# Patient Record
Sex: Female | Born: 1990 | Race: Black or African American | Hispanic: No | Marital: Single
Health system: Southern US, Community
[De-identification: ages and names within clinical notes are randomized; demographics above are authoritative.]

---

## 2011-03-02 ENCOUNTER — Ambulatory Visit (INDEPENDENT_AMBULATORY_CARE_PROVIDER_SITE_OTHER): Payer: 59 | Admitting: Gynecology

## 2011-03-02 DIAGNOSIS — N751 Abscess of Bartholin's gland: Secondary | ICD-10-CM

## 2011-03-02 DIAGNOSIS — N949 Unspecified condition associated with female genital organs and menstrual cycle: Secondary | ICD-10-CM

## 2011-03-02 HISTORY — PX: INCISE AND DRAIN ABCESS: PRO64

## 2011-03-13 ENCOUNTER — Ambulatory Visit (INDEPENDENT_AMBULATORY_CARE_PROVIDER_SITE_OTHER): Payer: 59 | Admitting: Gynecology

## 2011-03-13 DIAGNOSIS — N751 Abscess of Bartholin's gland: Secondary | ICD-10-CM

## 2011-03-26 ENCOUNTER — Encounter: Payer: 59 | Admitting: Gynecology

## 2011-03-29 ENCOUNTER — Encounter: Payer: Self-pay | Admitting: Anesthesiology

## 2011-04-03 ENCOUNTER — Encounter: Payer: 59 | Admitting: Gynecology

## 2018-04-01 ENCOUNTER — Encounter (HOSPITAL_COMMUNITY): Payer: Self-pay

## 2018-04-01 ENCOUNTER — Emergency Department (HOSPITAL_COMMUNITY)
Admission: EM | Admit: 2018-04-01 | Discharge: 2018-04-01 | Disposition: A | Discharge status: Home / Self Care | Payer: 59 | Attending: Emergency Medicine | Admitting: Emergency Medicine

## 2018-04-01 DIAGNOSIS — H5789 Other specified disorders of eye and adnexa: Secondary | ICD-10-CM | POA: Insufficient documentation

## 2018-04-01 DIAGNOSIS — H5711 Ocular pain, right eye: Secondary | ICD-10-CM | POA: Diagnosis present

## 2018-04-01 MED ORDER — HYPROMELLOSE (GONIOSCOPIC) 2.5 % OP SOLN: 2.0000 [drp] | OPHTHALMIC | 12 refills | Status: AC | PRN

## 2018-04-01 MED ORDER — FLUORESCEIN SODIUM 1 MG OP STRP
1.0000 | ORAL_STRIP | Freq: Once | OPHTHALMIC | Status: AC
  Administered 2018-04-01: 1 via OPHTHALMIC
  Filled 2018-04-01: qty 1

## 2018-04-01 MED ORDER — ERYTHROMYCIN 5 MG/GM OP OINT: TOPICAL_OINTMENT | OPHTHALMIC | 0 refills | Status: AC

## 2018-04-01 MED ORDER — TETRACAINE HCL 0.5 % OP SOLN
2.0000 [drp] | Freq: Once | OPHTHALMIC | Status: AC
  Administered 2018-04-01: 2 [drp] via OPHTHALMIC
  Filled 2018-04-01: qty 4

## 2018-04-01 NOTE — ED Provider Notes (Signed)
[facility] EMERGENCY DEPARTMENT Provider Note   CSN: [id number] Arrival date & time: 04/01/18  1843     History   Chief Complaint Chief Complaint  Patient presents with  . Eye Pain    HPI Sara Collier is a 27 y.o. female.  Sara Collier is a 27 y.o. Female who is otherwise healthy, presents to the ED for evaluation of 1 week of right eye pain.  She reports her eye and surrounding eyelids have had a constant dull ache, and has felt very irritated.  She reports when she wakes up in the morning there is crusting over her eyelids and she reports frequent clear drainage throughout the day.  This morning when she woke up she felt that the eye was a little bit swollen and hurting more than usual so she decided to come in for evaluation.  She denies redness of the eye.  No injury to the eye.  She denies any vision changes.  Does not wear contacts or glasses.  No issues with the left eye.  She has not tried anything prior to arrival to treat her symptoms.     History reviewed. No pertinent past medical history.  There are no active problems to display for this patient.   Past Surgical History:  Procedure Laterality Date  . INCISE AND DRAIN ABCESS  03/02/2011   BARTHOLIN ABCESS     OB History   None      Home Medications    Prior to Admission medications   Not on File    Family History Family History  Problem Relation Age of Onset  . Hypertension Father   . Hypertension Maternal Aunt   . Breast cancer Maternal Grandmother     Social History Social History   Tobacco Use  . Smoking status: Unknown If Ever Smoked  . Smokeless tobacco: Never Used  Substance Use Topics  . Alcohol use: No  . Drug use: Not on file     Allergies   Patient has no known allergies.   Review of Systems Review of Systems  Constitutional: Negative for chills and fever.  HENT: Negative for congestion, rhinorrhea and sore throat.   Eyes: Positive for pain,  discharge and itching. Negative for photophobia, redness and visual disturbance.  Gastrointestinal: Negative for nausea and vomiting.  Skin: Negative for color change and rash.  Neurological: Negative for dizziness, weakness, numbness and headaches.     Physical Exam Updated Vital Signs BP (!) 126/98   Pulse 68   Temp 98.7 F (37.1 C) (Oral)   Resp 20   LMP 03/19/2018   SpO2 100%   Physical Exam  Constitutional: She appears well-developed and well-nourished. No distress.  HENT:  Head: Normocephalic and atraumatic.  Eyes: Right eye exhibits no discharge. Left eye exhibits no discharge.  Visual acuity: 20/16 bilateral, 20/16 R, 20/20 L Left eye is unremarkable. Right eye with mild swelling over the upper eyelid, no erythema, upper eyelid is tender to palpation.  Conjunctivae are mildly erythematous, no scleral injection, no discharge noted on exam.  Eyes do seem somewhat dry.  Pupils are equal round and reactive, EOMs intact bilaterally, no consensual pain with pupillary constriction. Fluorescein exam of right eye shows no evidence of corneal abrasion, or dendritic staining Right eye pressure 15 mmHg  Pulmonary/Chest: Effort normal. No respiratory distress.  Neurological: She is alert. Coordination normal.  Skin: Skin is warm and dry. She is not diaphoretic.  Psychiatric: She has a normal mood and affect.  Her behavior is normal.  Nursing note and vitals reviewed.    ED Treatments / Results  Labs (all labs ordered are listed, but only abnormal results are displayed) Labs Reviewed - No data to display  EKG None  Radiology No results found.  Procedures Procedures (including critical care time)  Medications Ordered in ED Medications  tetracaine (PONTOCAINE) 0.5 % ophthalmic solution 2 drop (2 drops Right Eye Given 04/01/18 2000)  fluorescein ophthalmic strip 1 strip (1 strip Right Eye Given 04/01/18 2000)     Initial Impression / Assessment and Plan / ED Course  I  have reviewed the triage vital signs and the nursing notes.  Pertinent labs & imaging results that were available during my care of the patient were reviewed by me and considered in my medical decision making (see chart for details).  Patient presents with right eye pain and discomfort, she reports lots of clear drainage and crusting.  Normal visual acuity on exam there is minimal periorbital swelling primarily over the upper eyelid, no erythema, no proptosis.  Mild conjunctival erythema, no scleral injection or drainage present.  Normal eye pressure.  EOMs intact, no consensual pain, no evidence of iritis.  Fluorescein staining shows no evidence of corneal abrasion or dendritic staining.  Presentation not concerning for acute angle closure glaucoma.  Patient does have some mild swelling and tenderness over and above the upper eyelid, she could have a blocked tear duct versus very mild conjunctivitis.  Will treat with wetting drops and erythromycin ointment.  Patient to follow-up with ophthalmology.  Return precautions discussed.  Patient expresses understanding and is in agreement with plan.  Final Clinical Impressions(s) / ED Diagnoses   Final diagnoses:  Pain of right eye    ED Discharge Orders        Ordered    erythromycin ophthalmic ointment     04/01/18 2018    hydroxypropyl methylcellulose / hypromellose (ISOPTO TEARS / GONIOVISC) 2.5 % ophthalmic solution  As needed     04/01/18 2018       Dartha LodgeFord, Karmen Altamirano N, PA-C 04/02/18 Armandina [id number]    Nanavati, Ankit, MD 04/02/18 [phone]

## 2018-04-01 NOTE — Discharge Instructions (Signed)
Please use antibiotic eye ointment as directed you can also use wetting drops to help with dryness and discomfort.  Please use warm compresses.  Please follow-up with ophthalmology, especially if symptoms are not improving.  Return for changes in vision, worsening eye pain, swelling around the eye, or fevers.

## 2018-04-01 NOTE — ED Notes (Signed)
Patient able to ambulate independently  

## 2018-04-01 NOTE — ED Notes (Signed)
Pt does not wear contacts or glasses 

## 2018-04-01 NOTE — ED Triage Notes (Signed)
Pt states that for the past week her R eye has been hurting, no redness or swelling, denies injury. Reports drainage this AM

## 2019-05-02 ENCOUNTER — Emergency Department (HOSPITAL_COMMUNITY): Payer: 59

## 2019-05-02 ENCOUNTER — Other Ambulatory Visit: Payer: Self-pay

## 2019-05-02 ENCOUNTER — Emergency Department (HOSPITAL_COMMUNITY)
Admission: EM | Admit: 2019-05-02 | Discharge: 2019-05-03 | Disposition: A | Discharge status: Home / Self Care | Payer: 59 | Attending: Emergency Medicine | Admitting: Emergency Medicine

## 2019-05-02 ENCOUNTER — Encounter (HOSPITAL_COMMUNITY): Payer: Self-pay | Admitting: *Deleted

## 2019-05-02 DIAGNOSIS — Y92410 Unspecified street and highway as the place of occurrence of the external cause: Secondary | ICD-10-CM | POA: Diagnosis not present

## 2019-05-02 DIAGNOSIS — Y999 Unspecified external cause status: Secondary | ICD-10-CM | POA: Insufficient documentation

## 2019-05-02 DIAGNOSIS — M542 Cervicalgia: Secondary | ICD-10-CM | POA: Insufficient documentation

## 2019-05-02 DIAGNOSIS — Y93I9 Activity, other involving external motion: Secondary | ICD-10-CM | POA: Diagnosis not present

## 2019-05-02 DIAGNOSIS — R202 Paresthesia of skin: Secondary | ICD-10-CM | POA: Diagnosis not present

## 2019-05-02 DIAGNOSIS — M545 Low back pain: Secondary | ICD-10-CM | POA: Insufficient documentation

## 2019-05-02 LAB — I-STAT BETA HCG BLOOD, ED (MC, WL, AP ONLY): I-stat hCG, quantitative: <5 m[IU]/mL (ref <5)

## 2019-05-02 MED ORDER — BACLOFEN 10 MG PO TABS
10.0000 mg | ORAL_TABLET | Freq: Three times a day (TID) | ORAL | 0 refills | Status: AC
Start: 2019-05-02 — End: ?

## 2019-05-02 MED ORDER — ACETAMINOPHEN 500 MG PO TABS
1000.0000 mg | ORAL_TABLET | Freq: Once | ORAL | Status: AC
  Administered 2019-05-02: 1000 mg via ORAL
  Filled 2019-05-02: qty 2

## 2019-05-02 MED ORDER — MELOXICAM 15 MG PO TABS: 15.0000 mg | ORAL_TABLET | Freq: Every day | ORAL | 0 refills | Status: AC

## 2019-05-02 NOTE — ED Notes (Signed)
Pt back from CT
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
## 2019-05-02 NOTE — Discharge Instructions (Signed)
Your imaging was negative.  Return to the emergency department immediately if you develop any of the following symptoms: You have numbness, tingling, or weakness in the arms or legs. You develop severe headaches not relieved with medicine. You have severe neck pain, especially tenderness in the middle of the back of your neck. You have changes in bowel or bladder control. There is increasing pain in any area of the body. You have shortness of breath, light-headedness, dizziness, or fainting. You have chest pain. You feel sick to your stomach (nauseous), throw up (vomit), or sweat. You have increasing abdominal discomfort. There is blood in your urine, stool, or vomit. You have pain in your shoulder (shoulder strap areas). You feel your symptoms are getting worse.

## 2019-05-02 NOTE — ED Notes (Signed)
Patient transported to X-ray 
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
